# Patient Record
Sex: Male | Born: 1965 | Race: White | Hispanic: No | Marital: Married
Health system: Southern US, Community
[De-identification: ages and names within clinical notes are randomized; demographics above are authoritative.]

## PROBLEM LIST (undated history)

## (undated) HISTORY — PX: CERVICAL SPINE SURGERY: SHX589

## (undated) HISTORY — PX: KNEE ARTHROCENTESIS: SUR44

## (undated) HISTORY — PX: APPENDECTOMY: SHX54

## (undated) HISTORY — PX: REPLACEMENT TOTAL KNEE: SUR1224

## (undated) HISTORY — PX: OTHER SURGICAL HISTORY: SHX169

---

## 1997-10-25 ENCOUNTER — Ambulatory Visit (HOSPITAL_BASED_OUTPATIENT_CLINIC_OR_DEPARTMENT_OTHER): Admission: RE | Admit: 1997-10-25 | Discharge: 1997-10-25 | Payer: Self-pay | Admitting: Orthopedic Surgery

## 1997-11-15 ENCOUNTER — Ambulatory Visit (HOSPITAL_COMMUNITY): Admission: RE | Admit: 1997-11-15 | Discharge: 1997-11-15 | Payer: Self-pay | Admitting: Orthopedic Surgery

## 1997-11-27 ENCOUNTER — Ambulatory Visit (HOSPITAL_COMMUNITY): Admission: RE | Admit: 1997-11-27 | Discharge: 1997-11-27 | Payer: Self-pay | Admitting: Orthopedic Surgery

## 1997-12-05 ENCOUNTER — Ambulatory Visit (HOSPITAL_COMMUNITY): Admission: RE | Admit: 1997-12-05 | Discharge: 1997-12-05 | Payer: Self-pay | Admitting: Orthopedic Surgery

## 2000-09-29 ENCOUNTER — Encounter: Admission: RE | Admit: 2000-09-29 | Discharge: 2000-10-05 | Payer: Self-pay | Admitting: Family Medicine

## 2003-02-27 ENCOUNTER — Encounter: Admission: RE | Admit: 2003-02-27 | Discharge: 2003-03-21 | Payer: Self-pay | Admitting: Orthopedic Surgery

## 2003-07-03 ENCOUNTER — Encounter: Admission: RE | Admit: 2003-07-03 | Discharge: 2003-07-05 | Payer: Self-pay | Admitting: Family Medicine

## 2004-07-21 ENCOUNTER — Encounter: Admission: RE | Admit: 2004-07-21 | Discharge: 2004-08-25 | Payer: Self-pay | Admitting: Orthopedic Surgery

## 2005-07-15 ENCOUNTER — Encounter: Admission: RE | Admit: 2005-07-15 | Discharge: 2005-07-15 | Payer: Self-pay | Admitting: Family Medicine

## 2005-09-22 ENCOUNTER — Observation Stay (HOSPITAL_COMMUNITY): Admission: RE | Admit: 2005-09-22 | Discharge: 2005-09-23 | Payer: Self-pay | Admitting: Neurological Surgery

## 2008-04-19 ENCOUNTER — Ambulatory Visit (HOSPITAL_BASED_OUTPATIENT_CLINIC_OR_DEPARTMENT_OTHER): Admission: RE | Admit: 2008-04-19 | Discharge: 2008-04-20 | Payer: Self-pay | Admitting: Orthopedic Surgery

## 2008-04-27 ENCOUNTER — Ambulatory Visit: Payer: Self-pay

## 2008-05-07 ENCOUNTER — Encounter: Admission: RE | Admit: 2008-05-07 | Discharge: 2008-06-21 | Payer: Self-pay | Admitting: Orthopedic Surgery

## 2008-06-22 ENCOUNTER — Encounter: Admission: RE | Admit: 2008-06-22 | Discharge: 2008-09-20 | Payer: Self-pay | Admitting: Orthopedic Surgery

## 2010-08-14 ENCOUNTER — Other Ambulatory Visit (HOSPITAL_COMMUNITY): Payer: Self-pay | Admitting: Orthopedic Surgery

## 2010-08-14 ENCOUNTER — Ambulatory Visit (HOSPITAL_COMMUNITY)
Admission: RE | Admit: 2010-08-14 | Discharge: 2010-08-14 | Disposition: A | Discharge status: Home / Self Care | Payer: BC Managed Care – PPO | Source: Ambulatory Visit | Attending: Orthopedic Surgery | Admitting: Orthopedic Surgery

## 2010-08-14 DIAGNOSIS — Z9889 Other specified postprocedural states: Secondary | ICD-10-CM

## 2010-08-14 DIAGNOSIS — Z87821 Personal history of retained foreign body fully removed: Secondary | ICD-10-CM | POA: Insufficient documentation

## 2010-08-14 DIAGNOSIS — Z0389 Encounter for observation for other suspected diseases and conditions ruled out: Secondary | ICD-10-CM | POA: Insufficient documentation

## 2010-10-08 ENCOUNTER — Ambulatory Visit (HOSPITAL_BASED_OUTPATIENT_CLINIC_OR_DEPARTMENT_OTHER)
Admission: RE | Admit: 2010-10-08 | Discharge: 2010-10-08 | Disposition: A | Discharge status: Home / Self Care | Payer: BC Managed Care – PPO | Source: Ambulatory Visit | Attending: Orthopedic Surgery | Admitting: Orthopedic Surgery

## 2010-10-08 DIAGNOSIS — X58XXXA Exposure to other specified factors, initial encounter: Secondary | ICD-10-CM | POA: Insufficient documentation

## 2010-10-08 DIAGNOSIS — M898X9 Other specified disorders of bone, unspecified site: Secondary | ICD-10-CM | POA: Insufficient documentation

## 2010-10-08 DIAGNOSIS — Z01810 Encounter for preprocedural cardiovascular examination: Secondary | ICD-10-CM | POA: Insufficient documentation

## 2010-10-08 DIAGNOSIS — IMO0002 Reserved for concepts with insufficient information to code with codable children: Secondary | ICD-10-CM | POA: Insufficient documentation

## 2010-10-08 DIAGNOSIS — Z01812 Encounter for preprocedural laboratory examination: Secondary | ICD-10-CM | POA: Insufficient documentation

## 2010-10-08 DIAGNOSIS — M2240 Chondromalacia patellae, unspecified knee: Secondary | ICD-10-CM | POA: Insufficient documentation

## 2010-10-08 LAB — POCT HEMOGLOBIN-HEMACUE: Hemoglobin: 17.2 g/dL — ABNORMAL HIGH (ref 13.0–17.0)

## 2010-10-20 NOTE — Op Note (Signed)
  NAMECOLBEY, WIRTANEN               ACCOUNT NO.:  [id number]  MEDICAL RECORD NO.:  [id number]           PATIENT TYPE:  O  LOCATION:  XRAY                         FACILITY:  [facility]  PHYSICIAN:  Ollen Gross, M.D.    DATE OF BIRTH:  [birth year]  DATE OF PROCEDURE:  10/08/2010 DATE OF DISCHARGE:  08/14/2010                              OPERATIVE REPORT   PREOPERATIVE DIAGNOSIS:  Left knee medial meniscal tear.  POSTOPERATIVE DIAGNOSIS:  Left knee medial meniscal tear.  PROCEDURE:  Left knee arthroscopy with meniscal debridement.  SURGEON:  Ollen Gross, M.D.  ASSISTANT:  None.  ANESTHESIA:  General.  ESTIMATED BLOOD LOSS:  Minimal.  DRAINS:  None.  COMPLICATIONS:  None.  CONDITION.:  Stable to recovery room.  CLINICAL NOTE:  [name] is a 45 year old male who injured his left knee several months ago.  Exam and history suggested medial meniscal tear confirmed by MRI.  He presents now for arthroscopy and debridement.  PROCEDURE IN DETAIL:  After successful administration of general anesthetic, a tourniquet was placed high on his left thigh and his left lower extremity was prepped and draped in the usual sterile fashion. Standard superomedial and inferolateral incisions were made, inflow passed, superomedial camera passed inferolateral.  Arthroscopic visualization proceeds.  Undersurface of the patella and trochlea had minimal grade 1 and 2 chondromalacia.  No evidence of any unstable cartilage.  Medial and lateral gutters were visualized and there were no loose bodies.  Flexion valgus force was applied to the knee and the medial compartment was entered.  There was evidence of a tear in the body and posterior horn of the medial meniscus.  There was some chondral thinning with no evidence of any full-thickness chondral defects.  There was no evidence of any unstable articular cartilage.  Spinal needle was used to localize the inferomedial portal, small incision made  and dilator placed.  The meniscus debrided back to stable base with baskets and a 4.2-mm shaver and sealed off with the ArthroCare device.  The intercondylar notch was visualized.  There was osteophyte in the intercondylar notch.  He had old ACL graft but the osteophyte was obscuring it.  The lateral compartment was entered and it looked normal. The joint was again inspected.  No other tears, defects or loose bodies were noted.  The arthroscopic equipments were then removed from the inferior portals which were closed with interrupted 4-0 nylon.  A 20 cc of 0.25% Marcaine with epi was injected through the inflow cannula, then that was removed and that portal closed with nylon.  Incisions were cleaned and dried and a bulky sterile dressing was applied.  He was then awakened and he was transported to recovery in stable condition.     Ollen Gross, M.D.     FA/MEDQ  D:  10/08/2010  T:  10/08/2010  Job:  [id number]  Electronically Signed by Ollen Gross M.D. on 10/20/2010 07:09:15 AM

## 2010-11-04 NOTE — Op Note (Signed)
[name], [name]               ACCOUNT NO.:  [id number]   MEDICAL RECORD NO.:  [id number]          PATIENT TYPE:  AMB   LOCATION:  DSC                          FACILITY:  [facility]   PHYSICIAN:  Loreta Ave, M.D. DATE OF BIRTH:  10-May-1966   DATE OF PROCEDURE:  04/19/2008  DATE OF DISCHARGE:                               OPERATIVE REPORT   PREOPERATIVE DIAGNOSIS:  Right knee anterior cruciate ligament tear with  medial meniscus tear.  Anterolateral rotary instability.   POSTOPERATIVE DIAGNOSIS:  Right knee anterior cruciate ligament tear  with medial meniscus tear.  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  Complex  irreparable tearing posterior third of medial meniscus.  Radial tearing  mid third lateral meniscus.  A focal grade 4 chondral lesion in the  middle of the weightbearing dome of medial femoral condyle with chondral  flaps and loose bodies.   PROCEDURE:  Right knee exam under anesthesia, arthroscopy with  arthroscopic and endoscopic anterior cruciate ligament reconstruction,  patellar tendon allograft, bone-tendon-bone.  Bioabsorbable screw  fixation above and below and a distal PushLock anchor fixation distally.  Notchplasty.  Partial medial meniscectomy.  Debridement of lateral  meniscus.  Chondroplasty, removal of loose bodies, and microfracturing  of medial femoral condyle.   SURGEON:  Loreta Ave, MD   ASSISTANT:  Genene Churn. Barry Dienes, [place], present throughout the entire case and  necessary for timely completion of procedure.   ANESTHESIA:  General.   BLOOD LOSS:  Minimal.   SPECIMENS:  None.   CULTURES:  None.   COMPLICATIONS:  None.   DRESSING:  Sterile compressive with knee immobilizer.   TOURNIQUET TIME:  1 hour and 15 minutes.   PROCEDURE:  The patient was brought to the operating room and placed on  operating table in supine position.  After adequate anesthesia had been  obtained, right  knee was examined.  Full motion.  Markedly positive  Lachman, drawer, and pivot shift.  Tourniquet applied.  Prepped and  draped in usual sterile fashion.  Exsanguinated with elevation and  Esmarch.  Tourniquet inflated to 350 mmHg.  Three portals created, one  superolateral, one each medial and lateral parapatellar.  Inflow  catheter introduced.  Arthroscope introduced.  Knee inspected.  Good  patellofemoral cartilage and tracking.  Lateral meniscus radial tearing  middle third, saucerized out and tapered smoothly.  Lateral compartment,  otherwise, looked good.  Medially, what was left in the posterior third  of medial meniscus had marked complex, displaced tears.  Taken out to a  stable rim, tapered into remaining meniscus, not leaving much of  meniscus in the back at all.  In that compartment, there was a 1-cm  diameter grade 3 and 4 chondral lesion, acute on chronic.  Chondroplasty  to a stable surface.  Debrided the base.  Treated with multiple  microfracturing, confirming bleeding at completion.  ACL complete mid  substance tear debrided.  Notch moderately narrow, opened with  notchplasty.  A graft was prepared for 10 mm tunnel with bone-tendon-  bone with FiberWire at either end.  With  the arthroscope in place, an  incision was made medial to the tibial tubercle.  Guidewire driven from  there out through the footprint of the ACL on the tibia.  Overdrilled  with a 10-mm reamer.  Femoral guide was inserted across tibial tunnel  notch and back cortex of femur.  K-wire driven and then overdrilled with  a 10-mm reamer for appropriate depth of the grafts and pegs.  Debris  cleared throughout.  Tunnel was assessed, found to be in good position.  Tubing passers were inserted across both tunnels and out through a stab  wound in anterolateral thigh.  Nitinol wire brought in the medial portal  and out through the femoral tunnel.  Graft attached to tubing and passer  pulled in across the knee,  seated and the peg moves well in both  tunnels.  Firmly fixed above with a 9 x 25 bioabsorbable screw over the  nitinol wire.  Good capturing and fixation.  Nitinol wire removed.  Knee  placed in 70 degrees of flexion, posterior drawer applied, graft pulled  tightly, and fixed in the tibial tunnel with another 9 x 25  bioabsorbable screw.  Fairly good fixation, but I was a little concerned  about distal fixation.  I, therefore, exposed the tibia below the tibial  tunnel and took the sutures from the graft and firmly anchored them into  the tibia with pre-punching and then fixing with a 3.5 mm PushLock  anchor.  These augmented distal fixation sufficiently that I was  comfortable.  Arthroscope re-introduced.  Excellent stability.  Full  motion without impingement.  Negative Lachman and drawer.  All  instruments and fluid removed.  Wounds were all irrigated and closed  with subcutaneous subcuticular Vicryl.  Portals were all closed with  nylon.  Sterile compressive dressing applied.  Tourniquet deflated and  removed.  Knee immobilizer applied.  Anesthesia reversed.  Brought to  recovery room.  He tolerated the surgery well.  No complications.      Loreta Ave, M.D.  Electronically Signed     DFM/MEDQ  D:  04/19/2008  T:  04/20/2008  Job:  [id number]

## 2011-03-23 LAB — POCT HEMOGLOBIN-HEMACUE: Hemoglobin: 15.5

## 2017-08-19 DIAGNOSIS — M5127 Other intervertebral disc displacement, lumbosacral region: Secondary | ICD-10-CM | POA: Diagnosis not present

## 2017-08-19 DIAGNOSIS — M47817 Spondylosis without myelopathy or radiculopathy, lumbosacral region: Secondary | ICD-10-CM | POA: Diagnosis not present

## 2017-08-19 DIAGNOSIS — M5137 Other intervertebral disc degeneration, lumbosacral region: Secondary | ICD-10-CM | POA: Diagnosis not present

## 2017-08-23 DIAGNOSIS — M5137 Other intervertebral disc degeneration, lumbosacral region: Secondary | ICD-10-CM | POA: Diagnosis not present

## 2017-08-23 DIAGNOSIS — M5127 Other intervertebral disc displacement, lumbosacral region: Secondary | ICD-10-CM | POA: Diagnosis not present

## 2017-08-23 DIAGNOSIS — M47817 Spondylosis without myelopathy or radiculopathy, lumbosacral region: Secondary | ICD-10-CM | POA: Diagnosis not present

## 2017-10-25 DIAGNOSIS — M654 Radial styloid tenosynovitis [de Quervain]: Secondary | ICD-10-CM | POA: Diagnosis not present

## 2017-12-19 DIAGNOSIS — K4020 Bilateral inguinal hernia, without obstruction or gangrene, not specified as recurrent: Secondary | ICD-10-CM | POA: Diagnosis not present

## 2017-12-19 DIAGNOSIS — R1084 Generalized abdominal pain: Secondary | ICD-10-CM | POA: Diagnosis not present

## 2017-12-19 DIAGNOSIS — R109 Unspecified abdominal pain: Secondary | ICD-10-CM | POA: Diagnosis not present

## 2017-12-19 DIAGNOSIS — K219 Gastro-esophageal reflux disease without esophagitis: Secondary | ICD-10-CM | POA: Diagnosis not present

## 2017-12-19 DIAGNOSIS — R188 Other ascites: Secondary | ICD-10-CM | POA: Diagnosis not present

## 2017-12-19 DIAGNOSIS — R197 Diarrhea, unspecified: Secondary | ICD-10-CM | POA: Diagnosis not present

## 2017-12-21 DIAGNOSIS — R197 Diarrhea, unspecified: Secondary | ICD-10-CM | POA: Diagnosis not present

## 2017-12-21 DIAGNOSIS — K210 Gastro-esophageal reflux disease with esophagitis: Secondary | ICD-10-CM | POA: Diagnosis not present

## 2017-12-21 DIAGNOSIS — K529 Noninfective gastroenteritis and colitis, unspecified: Secondary | ICD-10-CM | POA: Diagnosis not present

## 2017-12-22 DIAGNOSIS — R197 Diarrhea, unspecified: Secondary | ICD-10-CM | POA: Diagnosis not present

## 2017-12-22 DIAGNOSIS — R1084 Generalized abdominal pain: Secondary | ICD-10-CM | POA: Diagnosis not present

## 2017-12-22 DIAGNOSIS — K529 Noninfective gastroenteritis and colitis, unspecified: Secondary | ICD-10-CM | POA: Diagnosis not present

## 2018-01-21 DIAGNOSIS — Z86010 Personal history of colonic polyps: Secondary | ICD-10-CM | POA: Diagnosis not present

## 2018-01-21 DIAGNOSIS — R195 Other fecal abnormalities: Secondary | ICD-10-CM | POA: Diagnosis not present

## 2018-01-21 DIAGNOSIS — K200 Eosinophilic esophagitis: Secondary | ICD-10-CM | POA: Diagnosis not present

## 2018-02-14 DIAGNOSIS — L0390 Cellulitis, unspecified: Secondary | ICD-10-CM | POA: Diagnosis not present

## 2018-02-18 DIAGNOSIS — K635 Polyp of colon: Secondary | ICD-10-CM | POA: Diagnosis not present

## 2018-02-18 DIAGNOSIS — Z86010 Personal history of colonic polyps: Secondary | ICD-10-CM | POA: Diagnosis not present

## 2018-02-18 DIAGNOSIS — D123 Benign neoplasm of transverse colon: Secondary | ICD-10-CM | POA: Diagnosis not present

## 2018-02-18 DIAGNOSIS — D125 Benign neoplasm of sigmoid colon: Secondary | ICD-10-CM | POA: Diagnosis not present

## 2018-04-11 DIAGNOSIS — M5127 Other intervertebral disc displacement, lumbosacral region: Secondary | ICD-10-CM | POA: Diagnosis not present

## 2018-04-11 DIAGNOSIS — M47817 Spondylosis without myelopathy or radiculopathy, lumbosacral region: Secondary | ICD-10-CM | POA: Diagnosis not present

## 2018-04-11 DIAGNOSIS — M5137 Other intervertebral disc degeneration, lumbosacral region: Secondary | ICD-10-CM | POA: Diagnosis not present

## 2018-04-13 DIAGNOSIS — M47817 Spondylosis without myelopathy or radiculopathy, lumbosacral region: Secondary | ICD-10-CM | POA: Diagnosis not present

## 2018-04-13 DIAGNOSIS — M5127 Other intervertebral disc displacement, lumbosacral region: Secondary | ICD-10-CM | POA: Diagnosis not present

## 2018-04-13 DIAGNOSIS — M5137 Other intervertebral disc degeneration, lumbosacral region: Secondary | ICD-10-CM | POA: Diagnosis not present

## 2018-04-20 DIAGNOSIS — M5137 Other intervertebral disc degeneration, lumbosacral region: Secondary | ICD-10-CM | POA: Diagnosis not present

## 2018-04-20 DIAGNOSIS — M47817 Spondylosis without myelopathy or radiculopathy, lumbosacral region: Secondary | ICD-10-CM | POA: Diagnosis not present

## 2018-04-20 DIAGNOSIS — M5127 Other intervertebral disc displacement, lumbosacral region: Secondary | ICD-10-CM | POA: Diagnosis not present

## 2018-07-07 DIAGNOSIS — M25532 Pain in left wrist: Secondary | ICD-10-CM | POA: Diagnosis not present

## 2018-07-14 DIAGNOSIS — M25532 Pain in left wrist: Secondary | ICD-10-CM | POA: Diagnosis not present

## 2021-08-17 ENCOUNTER — Emergency Department (HOSPITAL_BASED_OUTPATIENT_CLINIC_OR_DEPARTMENT_OTHER)
Admission: EM | Admit: 2021-08-17 | Discharge: 2021-08-17 | Disposition: A | Discharge status: Home / Self Care | Payer: BC Managed Care – PPO | Attending: Emergency Medicine | Admitting: Emergency Medicine

## 2021-08-17 ENCOUNTER — Other Ambulatory Visit: Payer: Self-pay

## 2021-08-17 ENCOUNTER — Encounter (HOSPITAL_BASED_OUTPATIENT_CLINIC_OR_DEPARTMENT_OTHER): Payer: Self-pay | Admitting: Emergency Medicine

## 2021-08-17 ENCOUNTER — Emergency Department (HOSPITAL_BASED_OUTPATIENT_CLINIC_OR_DEPARTMENT_OTHER): Payer: BC Managed Care – PPO

## 2021-08-17 DIAGNOSIS — Z96652 Presence of left artificial knee joint: Secondary | ICD-10-CM | POA: Insufficient documentation

## 2021-08-17 DIAGNOSIS — K5792 Diverticulitis of intestine, part unspecified, without perforation or abscess without bleeding: Secondary | ICD-10-CM | POA: Diagnosis not present

## 2021-08-17 DIAGNOSIS — Z20822 Contact with and (suspected) exposure to covid-19: Secondary | ICD-10-CM | POA: Diagnosis not present

## 2021-08-17 DIAGNOSIS — R1012 Left upper quadrant pain: Secondary | ICD-10-CM | POA: Diagnosis present

## 2021-08-17 DIAGNOSIS — R109 Unspecified abdominal pain: Secondary | ICD-10-CM

## 2021-08-17 LAB — CBC
HCT: 43 % (ref 39.0–52.0)
Hemoglobin: 15.4 g/dL (ref 13.0–17.0)
MCH: 33.8 pg (ref 26.0–34.0)
MCHC: 35.8 g/dL (ref 30.0–36.0)
MCV: 94.3 fL (ref 80.0–100.0)
Platelets: 194 10*3/uL (ref 150–400)
RBC: 4.56 MIL/uL (ref 4.22–5.81)
RDW: 12.6 % (ref 11.5–15.5)
WBC: 6.4 10*3/uL (ref 4.0–10.5)
nRBC: 0 % (ref 0.0–0.2)

## 2021-08-17 LAB — COMPREHENSIVE METABOLIC PANEL
ALT: 22 U/L (ref 0–44)
AST: 13 U/L — ABNORMAL LOW (ref 15–41)
Albumin: 4.1 g/dL (ref 3.5–5.0)
Alkaline Phosphatase: 87 U/L (ref 38–126)
Anion gap: 9 (ref 5–15)
BUN: 10 mg/dL (ref 6–20)
CO2: 26 mmol/L (ref 22–32)
Calcium: 8.9 mg/dL (ref 8.9–10.3)
Chloride: 103 mmol/L (ref 98–111)
Creatinine, Ser: 1.07 mg/dL (ref 0.61–1.24)
GFR, Estimated: >60 mL/min (ref >60)
Glucose, Bld: 108 mg/dL — ABNORMAL HIGH (ref 70–99)
Potassium: 3.7 mmol/L (ref 3.5–5.1)
Sodium: 138 mmol/L (ref 135–145)
Total Bilirubin: 0.8 mg/dL (ref 0.3–1.2)
Total Protein: 6.7 g/dL (ref 6.5–8.1)

## 2021-08-17 LAB — URINALYSIS, ROUTINE W REFLEX MICROSCOPIC
Bilirubin Urine: NEGATIVE
Glucose, UA: NEGATIVE mg/dL
Hgb urine dipstick: NEGATIVE
Ketones, ur: NEGATIVE mg/dL
Leukocytes,Ua: NEGATIVE
Nitrite: NEGATIVE
Protein, ur: NEGATIVE mg/dL
Specific Gravity, Urine: 1.012 (ref 1.005–1.030)
pH: 7 (ref 5.0–8.0)

## 2021-08-17 LAB — DIFFERENTIAL
Abs Immature Granulocytes: 0.02 10*3/uL (ref 0.00–0.07)
Basophils Absolute: 0 10*3/uL (ref 0.0–0.1)
Basophils Relative: 0 %
Eosinophils Absolute: 0.5 10*3/uL (ref 0.0–0.5)
Eosinophils Relative: 7 %
Immature Granulocytes: 0 %
Lymphocytes Relative: 14 %
Lymphs Abs: 0.9 10*3/uL (ref 0.7–4.0)
Monocytes Absolute: 0.3 10*3/uL (ref 0.1–1.0)
Monocytes Relative: 5 %
Neutro Abs: 4.7 10*3/uL (ref 1.7–7.7)
Neutrophils Relative %: 74 %

## 2021-08-17 LAB — RESP PANEL BY RT-PCR (FLU A&B, COVID) ARPGX2
Influenza A by PCR: NEGATIVE
Influenza B by PCR: NEGATIVE
SARS Coronavirus 2 by RT PCR: NEGATIVE

## 2021-08-17 LAB — LIPASE, BLOOD: Lipase: 13 U/L (ref 11–51)

## 2021-08-17 MED ORDER — MORPHINE SULFATE (PF) 4 MG/ML IV SOLN
4.0000 mg | Freq: Once | INTRAVENOUS | Status: AC
  Administered 2021-08-17: 4 mg via INTRAVENOUS
  Filled 2021-08-17: qty 1

## 2021-08-17 MED ORDER — AMOXICILLIN-POT CLAVULANATE 875-125 MG PO TABS: 1.0000 | ORAL_TABLET | Freq: Two times a day (BID) | ORAL | 0 refills | Status: AC

## 2021-08-17 MED ORDER — KETOROLAC TROMETHAMINE 15 MG/ML IJ SOLN
15.0000 mg | Freq: Once | INTRAMUSCULAR | Status: AC
  Administered 2021-08-17: 15 mg via INTRAVENOUS
  Filled 2021-08-17: qty 1

## 2021-08-17 MED ORDER — SODIUM CHLORIDE 0.9 % IV BOLUS
1000.0000 mL | Freq: Once | INTRAVENOUS | Status: AC
  Administered 2021-08-17: 1000 mL via INTRAVENOUS

## 2021-08-17 MED ORDER — OXYCODONE HCL 5 MG PO TABS: 5.0000 mg | ORAL_TABLET | ORAL | 0 refills | Status: AC | PRN

## 2021-08-17 MED ORDER — HYDROMORPHONE HCL 1 MG/ML IJ SOLN
0.5000 mg | Freq: Once | INTRAMUSCULAR | Status: AC
  Administered 2021-08-17: 0.5 mg via INTRAVENOUS
  Filled 2021-08-17: qty 1

## 2021-08-17 MED ORDER — IOHEXOL 300 MG/ML  SOLN
100.0000 mL | Freq: Once | INTRAMUSCULAR | Status: AC | PRN
  Administered 2021-08-17: 100 mL via INTRAVENOUS

## 2021-08-17 MED ORDER — ONDANSETRON HCL 4 MG/2ML IJ SOLN
4.0000 mg | Freq: Once | INTRAMUSCULAR | Status: AC
  Administered 2021-08-17: 4 mg via INTRAVENOUS
  Filled 2021-08-17: qty 2

## 2021-08-17 MED ORDER — ONDANSETRON HCL 4 MG PO TABS: 4.0000 mg | ORAL_TABLET | ORAL | 0 refills | Status: AC | PRN

## 2021-08-17 NOTE — ED Notes (Signed)
RT Note: IV/Labs obtained, RN made aware.

## 2021-08-17 NOTE — ED Triage Notes (Signed)
LLQ sharp pain x 3 days , on and off , last BM yesterday , denies urinary symptoms nor NV, yet 1 episode diarrhea

## 2021-08-17 NOTE — ED Notes (Signed)
RT Note: Obtained nasal swab/labelled/given to Lab, RN made aware.

## 2021-08-17 NOTE — Discharge Instructions (Addendum)
It was a pleasure caring for you today in the emergency department.   F/u with Gastroenterology  Please return to the emergency department for any worsening or worrisome symptoms.

## 2021-08-17 NOTE — ED Provider Notes (Signed)
[name] DE[facility] Provider Note   CSN: [id number] Arrival date & time: 08/17/21  1191     History  Chief Complaint  Patient presents with   Abdominal Pain    [name] is a 56 y.o. male.  This is a 57 y.o. male with significant medical history as below, including appendectomy multiple years ago, who presents to the ED with complaint of left abdominal, flank pain.  Location: see Above Duration: 3 days Onset: Gradual Timing: Constant Description: Sharp, stabbing, tightness; does not radiate Severity: Mild Exacerbating/Alleviating Factors: Worse with palpation, twisting, leaning over.  Unrelieved with OTC gas relief medication Associated Symptoms: Mild nausea without vomiting Pertinent Negatives: No vomiting, chest pain, dyspnea, change in bowel or bladder function, no scrotal or penile discomfort.  No rashes.  No trauma.  No recent travel or sick contact   Per chart review pt recently on Augmentin and prednisone approximately 2 to 3 weeks ago secondary to pneumonia.  Pneumonia symptoms have since improved.  History reviewed. No pertinent past medical history.  Past Surgical History: No date: APPENDECTOMY No date: CERVICAL SPINE SURGERY No date: KNEE ARTHROCENTESIS; Right No date: REPLACEMENT TOTAL KNEE; Left No date: right knee scope    The history is provided by the patient and the spouse. No language interpreter was used.  Abdominal Pain Associated symptoms: nausea   Associated symptoms: no chest pain, no chills, no cough, no fever, no hematuria, no shortness of breath and no vomiting       Home Medications Prior to Admission medications   Medication Sig Start Date End Date Taking? Authorizing Provider  amoxicillin-clavulanate (AUGMENTIN) 875-125 MG tablet Take 1 tablet by mouth every 12 (twelve) hours for 7 days. 08/17/21 08/24/21 Yes Wynona Dove A, DO  ondansetron (ZOFRAN) 4 MG tablet Take 1 tablet (4 mg total) by mouth every 4 (four)  hours as needed for nausea or vomiting. 08/17/21  Yes Wynona Dove A, DO  oxyCODONE (ROXICODONE) 5 MG immediate release tablet Take 1 tablet (5 mg total) by mouth every 4 (four) hours as needed for severe pain. 08/17/21  Yes Jeanell Sparrow, DO      Allergies    Patient has no known allergies.    Review of Systems   Review of Systems  Constitutional:  Negative for chills and fever.  HENT:  Negative for facial swelling and trouble swallowing.   Eyes:  Negative for photophobia and visual disturbance.  Respiratory:  Negative for cough and shortness of breath.   Cardiovascular:  Negative for chest pain and palpitations.  Gastrointestinal:  Positive for abdominal pain and nausea. Negative for vomiting.  Endocrine: Negative for polydipsia and polyuria.  Genitourinary:  Negative for difficulty urinating and hematuria.  Musculoskeletal:  Negative for gait problem and joint swelling.  Skin:  Negative for pallor and rash.  Neurological:  Negative for syncope and headaches.  Psychiatric/Behavioral:  Negative for agitation and confusion.    Physical Exam Updated Vital Signs BP (!) 142/95    Pulse (!) 53    Temp 97.8 F (36.6 C) (Oral)    Resp 16    Ht 5\' 7"  (4.782 m)    Wt 88 kg    SpO2 100%    BMI 30.38 kg/m  Physical Exam Vitals and nursing note reviewed.  Constitutional:      General: He is not in acute distress.    Appearance: He is well-developed.  HENT:     Head: Normocephalic and atraumatic.  Right Ear: External ear normal.     Left Ear: External ear normal.     Mouth/Throat:     Mouth: Mucous membranes are moist.  Eyes:     General: No scleral icterus. Cardiovascular:     Rate and Rhythm: Normal rate and regular rhythm.     Pulses: Normal pulses.     Heart sounds: Normal heart sounds.  Pulmonary:     Effort: Pulmonary effort is normal. No tachypnea or respiratory distress.     Breath sounds: Normal breath sounds.  Abdominal:     General: Abdomen is flat.     Palpations:  Abdomen is soft.     Tenderness: There is abdominal tenderness in the left upper quadrant and left lower quadrant.       Comments: Nonperitoneal abdomen  Musculoskeletal:        General: Normal range of motion.     Cervical back: Normal range of motion.     Right lower leg: No edema.     Left lower leg: No edema.  Skin:    General: Skin is warm and dry.     Capillary Refill: Capillary refill takes less than 2 seconds.  Neurological:     Mental Status: He is alert and oriented to person, place, and time.  Psychiatric:        Mood and Affect: Mood normal.        Behavior: Behavior normal.    ED Results / Procedures / Treatments   Labs (all labs ordered are listed, but only abnormal results are displayed) Labs Reviewed  COMPREHENSIVE METABOLIC PANEL - Abnormal; Notable for the following components:      Result Value   Glucose, Bld 108 (*)    AST 13 (*)    All other components within normal limits  URINALYSIS, ROUTINE W REFLEX MICROSCOPIC - Abnormal; Notable for the following components:   Color, Urine COLORLESS (*)    All other components within normal limits  RESP PANEL BY RT-PCR (FLU A&B, COVID) ARPGX2  LIPASE, BLOOD  CBC  DIFFERENTIAL    EKG None  Radiology CT ABDOMEN PELVIS W CONTRAST  Result Date: 08/17/2021 CLINICAL DATA:  Left lower quadrant pain for 3 days.  Diarrhea. EXAM: CT ABDOMEN AND PELVIS WITH CONTRAST TECHNIQUE: Multidetector CT imaging of the abdomen and pelvis was performed using the standard protocol following bolus administration of intravenous contrast. RADIATION DOSE REDUCTION: This exam was performed according to the departmental dose-optimization program which includes automated exposure control, adjustment of the mA and/or kV according to patient size and/or use of iterative reconstruction technique. CONTRAST:  156mL OMNIPAQUE IOHEXOL 300 MG/ML  SOLN COMPARISON:  None. FINDINGS: Lower Chest: No acute findings. Hepatobiliary: Several tiny hepatic cysts  are noted. No hepatic masses identified. Gallbladder is unremarkable. No evidence of biliary ductal dilatation. Pancreas:  No mass or inflammatory changes. Spleen: Within normal limits in size and appearance. Adrenals/Urinary Tract: No masses identified. No evidence of ureteral calculi or hydronephrosis. Stomach/Bowel: Mild diverticulitis is seen involving the distal descending and proximal sigmoid colon. No evidence of perforation or abscess. Vascular/Lymphatic: No pathologically enlarged lymph nodes. No acute vascular findings. Aortic atherosclerotic calcification noted. Reproductive:  No mass or other significant abnormality. Other:  None. Musculoskeletal: No suspicious bone lesions identified. Moderate degenerative disc disease noted at L5-S1. IMPRESSION: Mild diverticulitis involving distal descending and proximal sigmoid colon. No evidence of perforation, abscess, or other complication. Aortic Atherosclerosis (ICD10-I70.0). Electronically Signed   By: Marlaine Hind M.D.   On:  08/17/2021 11:24    Procedures Procedures    Medications Ordered in ED Medications  HYDROmorphone (DILAUDID) injection 0.5 mg (has no administration in time range)  morphine (PF) 4 MG/ML injection 4 mg (4 mg Intravenous Given 08/17/21 1032)  ondansetron (ZOFRAN) injection 4 mg (4 mg Intravenous Given 08/17/21 1032)  sodium chloride 0.9 % bolus 1,000 mL (1,000 mLs Intravenous New Bag/Given 08/17/21 1033)  iohexol (OMNIPAQUE) 300 MG/ML solution 100 mL (100 mLs Intravenous Contrast Given 08/17/21 1057)  ketorolac (TORADOL) 15 MG/ML injection 15 mg (15 mg Intravenous Given 08/17/21 1128)    ED Course/ Medical Decision Making/ A&P                           Medical Decision Making  CC: Left-sided abdominal pain  This patient presents to the Emergency Department for the above complaint. This involves an extensive number of treatment options and is a complaint that carries with it a high risk of complications and morbidity. Vital  signs were reviewed. Serious etiologies considered.  Record review:  Previous records obtained and reviewed, see HPI  Additional history obtained from spouse  Medical and surgical history as noted above.   Work up as above, notable for:   Labs & imaging results that were available during my care of the patient were visualized by me and considered in my medical decision making.   I ordered imaging studies which included CT abdomen pelvis with IV contrast  and I visualized the imaging and I agree with radiologist interpretation.  Mild uncomplicated diverticulitis  Cardiac monitoring reviewed and interpreted personally which shows NSR  Social determinants of health include - N/a  Management: Analgesics, IVF, antiemetics   Reassessment:  Patient reports he is feeling much better, he is tolerant p.o. intake without difficulty.  Repeat abd exam is soft, mildly tender left lower quadrant.   Start patient on oral antibiotics for uncomplicated diverticulitis, liquid diet, home analgesics.  O/p follow-up with gastroenterology, colonoscopy was 2 years ago per pt.   The patient improved significantly and was discharged in stable condition. Detailed discussions were had with the patient regarding current findings, and need for close f/u with PCP or on call doctor. The patient has been instructed to return immediately if the symptoms worsen in any way for re-evaluation. Patient verbalized understanding and is in agreement with current care plan. All questions answered prior to discharge.        This chart was dictated using voice recognition software.  Despite best efforts to proofread,  errors can occur which can change the documentation meaning.    Amount and/or Complexity of Data Reviewed Independent Historian: spouse External Data Reviewed: labs, radiology and notes. Labs: ordered. Decision-making details documented in ED Course. Radiology: ordered. Decision-making details  documented in ED Course. Discussion of management or test interpretation with external provider(s): Hospitalization was considered however feel patient benefit from outpatient therapy.  Risk Prescription drug management. Decision regarding hospitalization.           Final Clinical Impression(s) / ED Diagnoses Final diagnoses:  Diverticulitis  Abdominal pain, unspecified abdominal location    Rx / DC Orders ED Discharge Orders          Ordered    amoxicillin-clavulanate (AUGMENTIN) 875-125 MG tablet  Every 12 hours        08/17/21 1238    oxyCODONE (ROXICODONE) 5 MG immediate release tablet  Every 4 hours PRN  08/17/21 1238    ondansetron (ZOFRAN) 4 MG tablet  Every 4 hours PRN        08/17/21 1238              Jeanell Sparrow, DO 08/17/21 1238

## 2021-08-17 NOTE — ED Notes (Signed)
Patient transported to CT 

## 2022-09-01 IMAGING — CT CT ABD-PELV W/ CM
2 of 5 series · 16 of 46 positions shown, 18 images · IV contrast (APPLIED)
Comparison: None.

CLINICAL DATA: Left lower quadrant pain for 3 days.  Diarrhea.

EXAM:
CT ABDOMEN AND PELVIS WITH CONTRAST
TECHNIQUE: Multidetector CT imaging of the abdomen and pelvis was performed
using the standard protocol following bolus administration of
intravenous contrast.

[Series 2: abd pel w · axial · 0.78mm/px · z∈[-481,-41]mm · 13 of 100 slices shown, 15 images]
[im 6/100  soft-tissue]
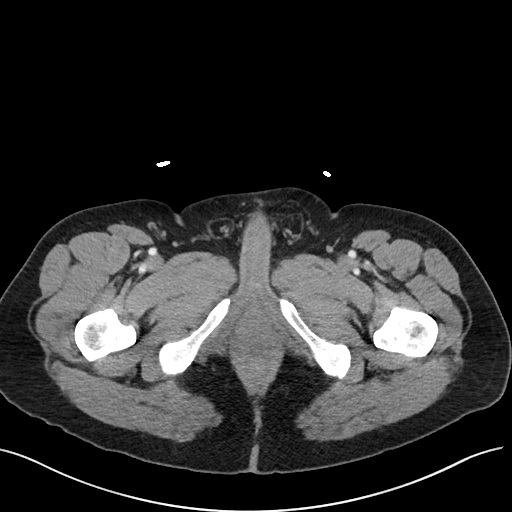
[im 6/100  bone]
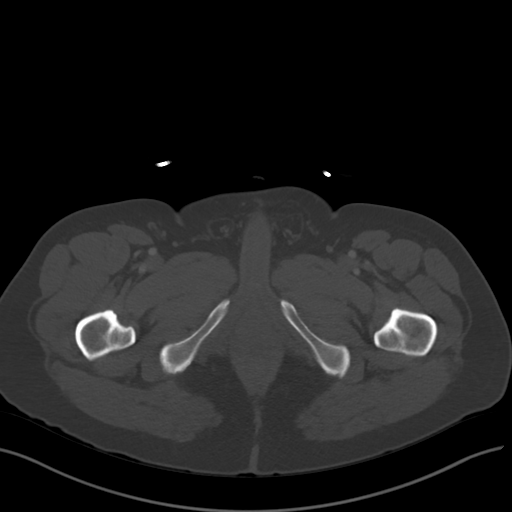
[im 16/100  soft-tissue]
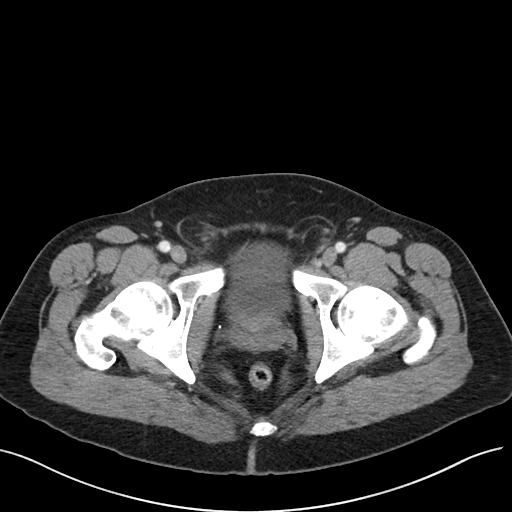
[im 21/100  soft-tissue]
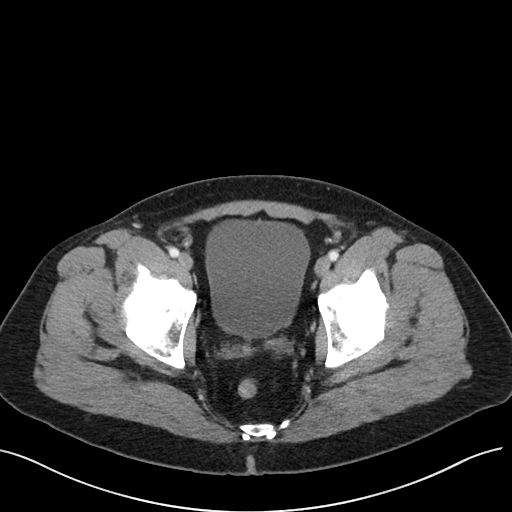
[im 27/100  soft-tissue]
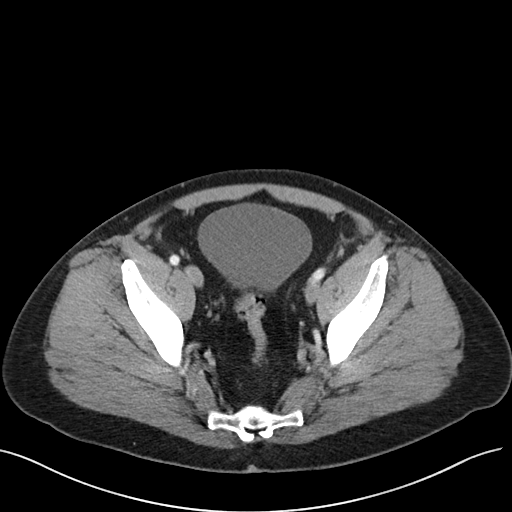
[im 37/100  soft-tissue]
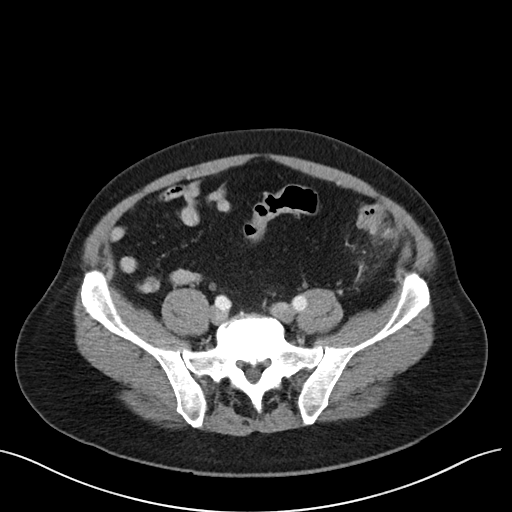
[im 42/100  soft-tissue]
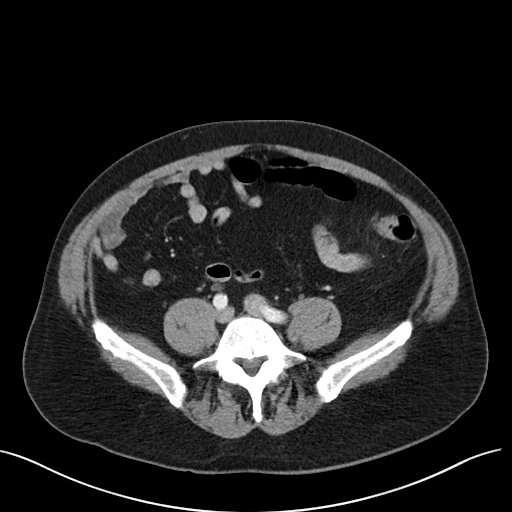
[im 53/100  soft-tissue]
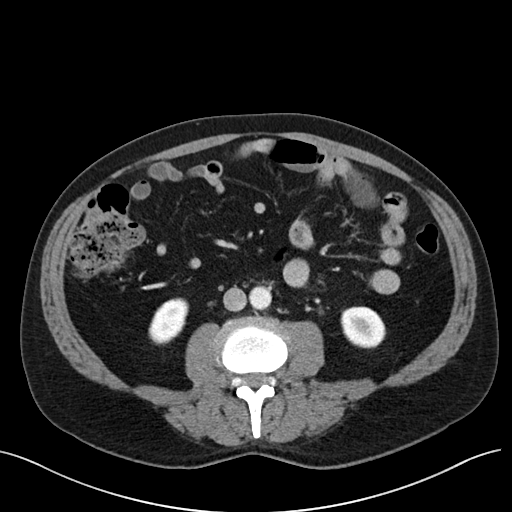
[im 58/100  soft-tissue]
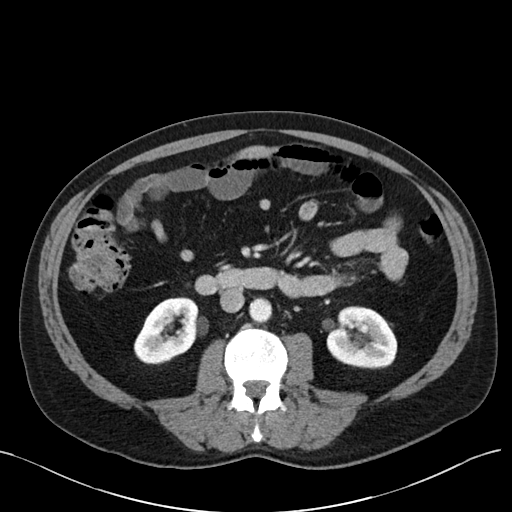
[im 63/100  soft-tissue]
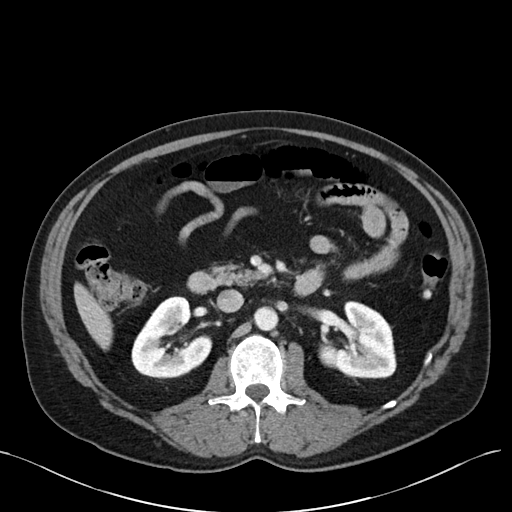
[im 63/100  bone]
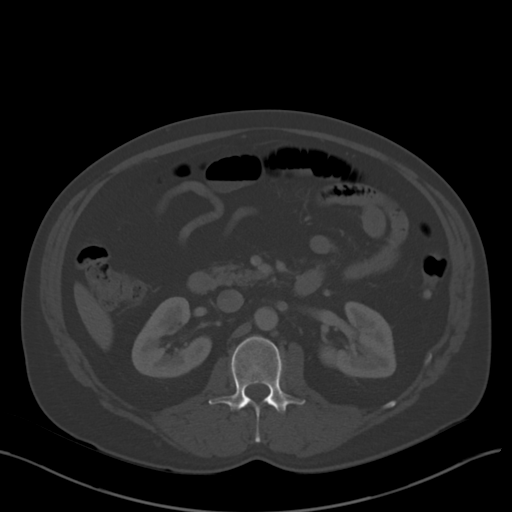
[im 73/100  soft-tissue]
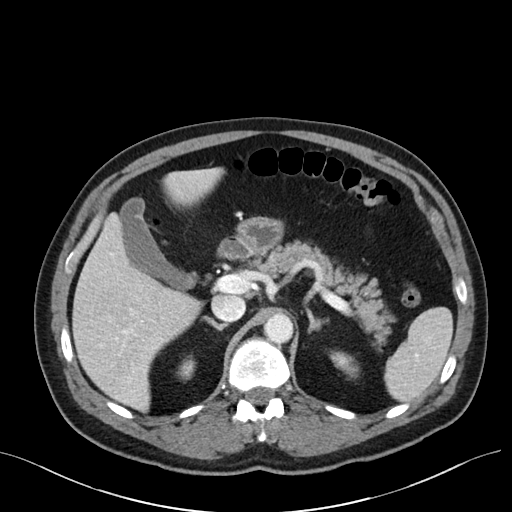
[im 79/100  soft-tissue]
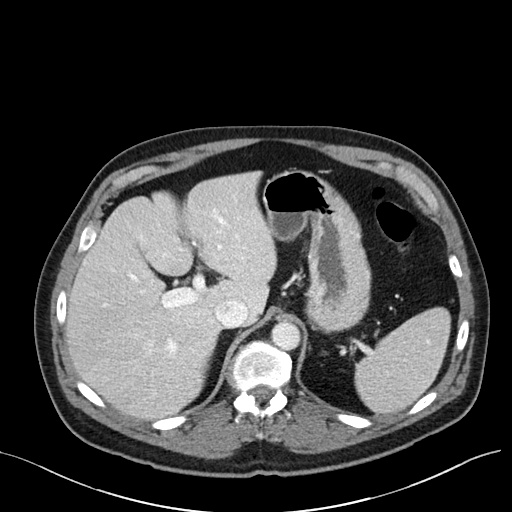
[im 84/100  soft-tissue]
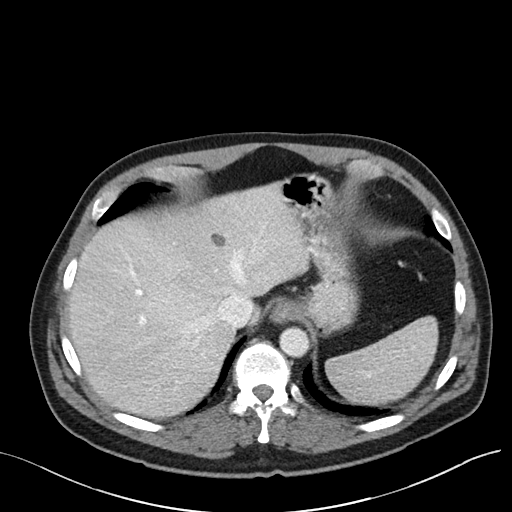
[im 94/100  soft-tissue]
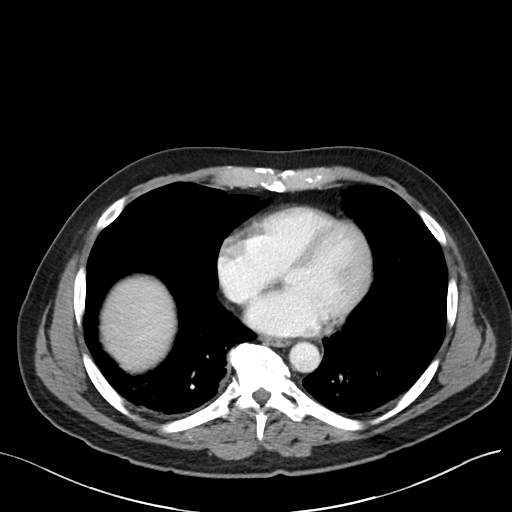

[Series 5: coronal · coronal · 0.82mm/px · 3 of 97 slices shown]
[im 33/97  soft-tissue]
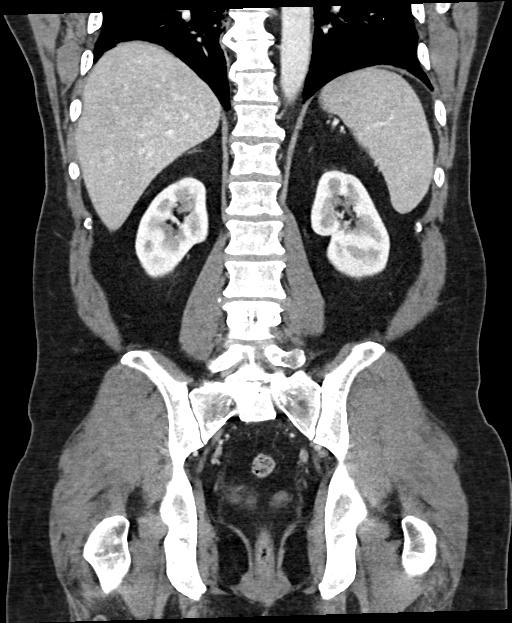
[im 43/97  soft-tissue]
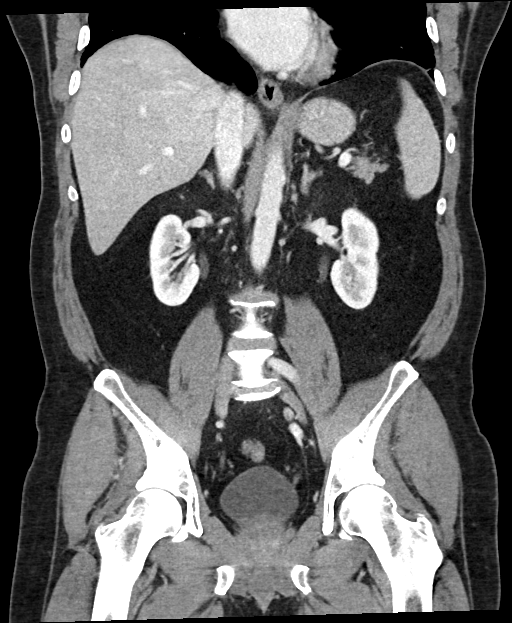
[im 54/97  soft-tissue]
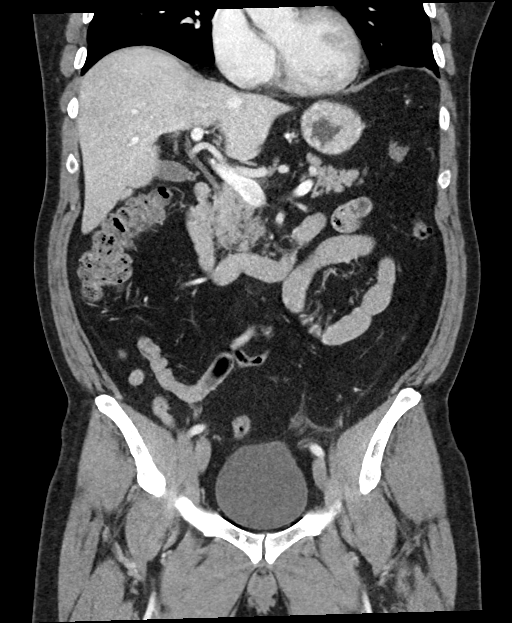

[16 of 46 positions shown; findings below may reference images not displayed]

RADIATION DOSE REDUCTION: This exam was performed according to the
departmental dose-optimization program which includes automated
exposure control, adjustment of the mA and/or kV according to
patient size and/or use of iterative reconstruction technique.

CONTRAST:  100mL OMNIPAQUE IOHEXOL 300 MG/ML  SOLN
FINDINGS: Lower Chest: No acute findings.

Hepatobiliary: Several tiny hepatic cysts are noted. No hepatic
masses identified. Gallbladder is unremarkable. No evidence of
biliary ductal dilatation.

Pancreas:  No mass or inflammatory changes.

Spleen: Within normal limits in size and appearance.

Adrenals/Urinary Tract: No masses identified. No evidence of
ureteral calculi or hydronephrosis.

Stomach/Bowel: Mild diverticulitis is seen involving the distal
descending and proximal sigmoid colon. No evidence of perforation or
abscess.

Vascular/Lymphatic: No pathologically enlarged lymph nodes. No acute
vascular findings. Aortic atherosclerotic calcification noted.

Reproductive:  No mass or other significant abnormality.

Other:  None.

Musculoskeletal: No suspicious bone lesions identified. Moderate
degenerative disc disease noted at L5-S1.
IMPRESSION: Mild diverticulitis involving distal descending and proximal sigmoid
colon. No evidence of perforation, abscess, or other complication.

Aortic Atherosclerosis ([id number]).

## 2024-07-06 ENCOUNTER — Telehealth (HOSPITAL_COMMUNITY): Payer: Self-pay

## 2024-07-06 NOTE — Telephone Encounter (Signed)
 Received Referral from Pulmonary Rehab from [facility]. Called patient to see if he is interested in the Pulmonary Rehab Program. Patient stated that he wanted to finish getting his cardiac testing and workup completed before he make up his mind.   Advised that he could call us  when he is ready and we would be glad to get him scheduled.
